# Patient Record
Sex: Female | Born: 1985 | Race: White | Hispanic: No | Marital: Single | Smoking: Current every day smoker
Health system: Southern US, Community
[De-identification: ages and names within clinical notes are randomized; demographics above are authoritative.]

## PROBLEM LIST (undated history)

## (undated) HISTORY — PX: OTHER SURGICAL HISTORY: SHX169

---
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
## 2010-04-28 ENCOUNTER — Emergency Department: Payer: Self-pay | Admitting: Internal Medicine

## 2011-04-20 ENCOUNTER — Emergency Department: Payer: Self-pay | Admitting: Emergency Medicine

## 2011-04-20 LAB — URINALYSIS, COMPLETE
Blood: NEGATIVE
Ketone: NEGATIVE
Ph: 7 (ref 4.5–8.0)
Protein: NEGATIVE
RBC,UR: 2 /HPF (ref 0–5)
Squamous Epithelial: 5

## 2011-04-20 LAB — PREGNANCY, URINE: Pregnancy Test, Urine: NEGATIVE m[IU]/mL

## 2011-07-24 DIAGNOSIS — J45909 Unspecified asthma, uncomplicated: Secondary | ICD-10-CM | POA: Insufficient documentation

## 2012-02-01 ENCOUNTER — Emergency Department: Payer: Self-pay | Admitting: Emergency Medicine

## 2012-02-01 LAB — CBC
HCT: 41.9 % (ref 35.0–47.0)
HGB: 14.7 g/dL (ref 12.0–16.0)
MCH: 30.6 pg (ref 26.0–34.0)
MCV: 87 fL (ref 80–100)
Platelet: 240 10*3/uL (ref 150–440)
RBC: 4.8 10*6/uL (ref 3.80–5.20)
WBC: 5.3 10*3/uL (ref 3.6–11.0)

## 2012-02-01 LAB — LIPASE, BLOOD: Lipase: 111 U/L (ref 73–393)

## 2012-02-01 LAB — PREGNANCY, URINE: Pregnancy Test, Urine: NEGATIVE m[IU]/mL

## 2012-02-01 LAB — URINALYSIS, COMPLETE
Ketone: NEGATIVE
Nitrite: NEGATIVE
Ph: 5 (ref 4.5–8.0)
Protein: NEGATIVE
RBC,UR: 34 /HPF (ref 0–5)
WBC UR: 7 /HPF (ref 0–5)

## 2012-02-01 LAB — COMPREHENSIVE METABOLIC PANEL
Albumin: 4.5 g/dL (ref 3.4–5.0)
Alkaline Phosphatase: 76 U/L (ref 50–136)
Anion Gap: 5 — ABNORMAL LOW (ref 7–16)
BUN: 11 mg/dL (ref 7–18)
Bilirubin,Total: 1 mg/dL (ref 0.2–1.0)
Calcium, Total: 9.1 mg/dL (ref 8.5–10.1)
Chloride: 109 mmol/L — ABNORMAL HIGH (ref 98–107)
Co2: 27 mmol/L (ref 21–32)
Glucose: 86 mg/dL (ref 65–99)
Potassium: 3.9 mmol/L (ref 3.5–5.1)
SGOT(AST): 20 U/L (ref 15–37)
Sodium: 141 mmol/L (ref 136–145)
Total Protein: 8.3 g/dL — ABNORMAL HIGH (ref 6.4–8.2)

## 2012-11-28 ENCOUNTER — Emergency Department: Payer: Self-pay | Admitting: Emergency Medicine

## 2012-11-28 LAB — URINALYSIS, COMPLETE
Glucose,UR: NEGATIVE mg/dL (ref 0–75)
Nitrite: NEGATIVE
Ph: 7 (ref 4.5–8.0)
RBC,UR: 1 /HPF (ref 0–5)
Specific Gravity: 1.015 (ref 1.003–1.030)

## 2012-11-28 LAB — COMPREHENSIVE METABOLIC PANEL
Albumin: 4 g/dL (ref 3.4–5.0)
BUN: 7 mg/dL (ref 7–18)
Bilirubin,Total: 0.6 mg/dL (ref 0.2–1.0)
Calcium, Total: 9.3 mg/dL (ref 8.5–10.1)
Chloride: 105 mmol/L (ref 98–107)
Co2: 29 mmol/L (ref 21–32)
Creatinine: 0.75 mg/dL (ref 0.60–1.30)
EGFR (African American): >60
Glucose: 92 mg/dL (ref 65–99)
Potassium: 3.9 mmol/L (ref 3.5–5.1)
SGOT(AST): 12 U/L — ABNORMAL LOW (ref 15–37)
Sodium: 138 mmol/L (ref 136–145)

## 2012-11-28 LAB — CBC
MCH: 30.6 pg (ref 26.0–34.0)
MCHC: 35.1 g/dL (ref 32.0–36.0)
MCV: 87 fL (ref 80–100)
Platelet: 223 10*3/uL (ref 150–440)
RDW: 12.6 % (ref 11.5–14.5)

## 2014-03-04 ENCOUNTER — Emergency Department: Payer: Self-pay | Admitting: Emergency Medicine

## 2014-03-04 LAB — COMPREHENSIVE METABOLIC PANEL
AST: 18 U/L (ref 15–37)
Albumin: 4.7 g/dL (ref 3.4–5.0)
Alkaline Phosphatase: 79 U/L
Anion Gap: 7 (ref 7–16)
BUN: 14 mg/dL (ref 7–18)
Bilirubin,Total: 0.7 mg/dL (ref 0.2–1.0)
CALCIUM: 9.4 mg/dL (ref 8.5–10.1)
CO2: 28 mmol/L (ref 21–32)
Chloride: 104 mmol/L (ref 98–107)
Creatinine: 0.91 mg/dL (ref 0.60–1.30)
EGFR (African American): >60
EGFR (Non-African Amer.): >60
Glucose: 98 mg/dL (ref 65–99)
Osmolality: 278 (ref 275–301)
Potassium: 3.4 mmol/L — ABNORMAL LOW (ref 3.5–5.1)
SGPT (ALT): 22 U/L
SODIUM: 139 mmol/L (ref 136–145)
Total Protein: 8.6 g/dL — ABNORMAL HIGH (ref 6.4–8.2)

## 2014-03-04 LAB — URINALYSIS, COMPLETE
BILIRUBIN, UR: NEGATIVE
Glucose,UR: NEGATIVE mg/dL (ref 0–75)
KETONE: NEGATIVE
Nitrite: NEGATIVE
PROTEIN: NEGATIVE
Ph: 6 (ref 4.5–8.0)
RBC,UR: 4 /HPF (ref 0–5)
SPECIFIC GRAVITY: 1.019 (ref 1.003–1.030)
Squamous Epithelial: 30
WBC UR: 23 /HPF (ref 0–5)

## 2014-03-04 LAB — CBC
HCT: 43.8 % (ref 35.0–47.0)
HGB: 15 g/dL (ref 12.0–16.0)
MCH: 30.4 pg (ref 26.0–34.0)
MCHC: 34.2 g/dL (ref 32.0–36.0)
MCV: 89 fL (ref 80–100)
Platelet: 282 10*3/uL (ref 150–440)
RBC: 4.93 10*6/uL (ref 3.80–5.20)
RDW: 12.9 % (ref 11.5–14.5)
WBC: 7.3 10*3/uL (ref 3.6–11.0)

## 2014-03-04 LAB — LIPASE, BLOOD: Lipase: 202 U/L (ref 73–393)

## 2015-03-29 IMAGING — CT CT ABD-PELV W/ CM
1 of 2 series · 16 of 32 positions shown, 20 images · non-contrast
Comparison: none

REASON FOR EXAM: (1) RLQ pain; (2) RLQ pain;    NOTE: Nursing to Give
Oral CT Contrast
COMMENTS:

PROCEDURE:     CT  - CT ABDOMEN / PELVIS  W  - November 28, 2012  [DATE]
RESULT:
Comparison is made to a prior study dated 02/01/2012.
TECHNIQUE: Helical 3 mm sections were obtained from the lung bases through
the pubic symphysis status post intravenous administration of 85 ml of
Lsovue-34M.

[Series 2: 3mm soft tissue · axial · 0.66mm/px · z∈[+141,+564]mm · 16 of 155 slices shown, 20 images]
[im 7/155  soft-tissue]
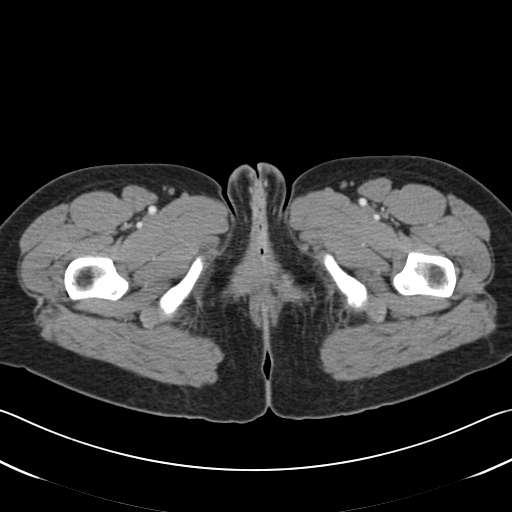
[im 7/155  bone]
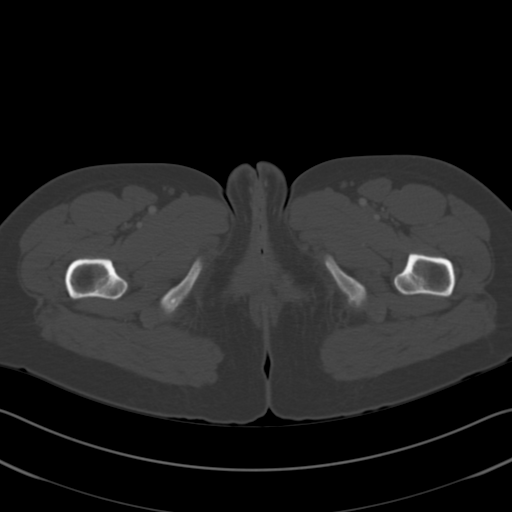
[im 20/155  soft-tissue]
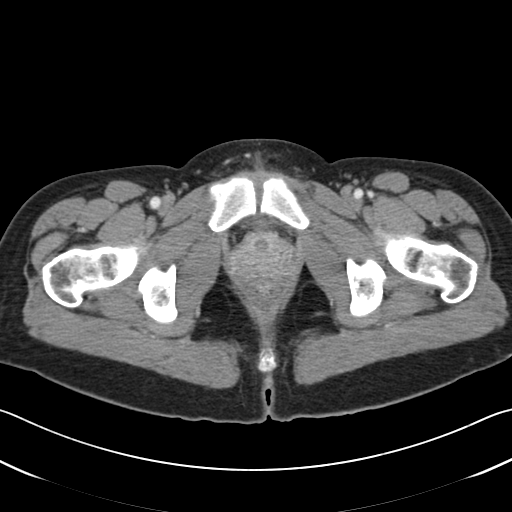
[im 33/155  soft-tissue]
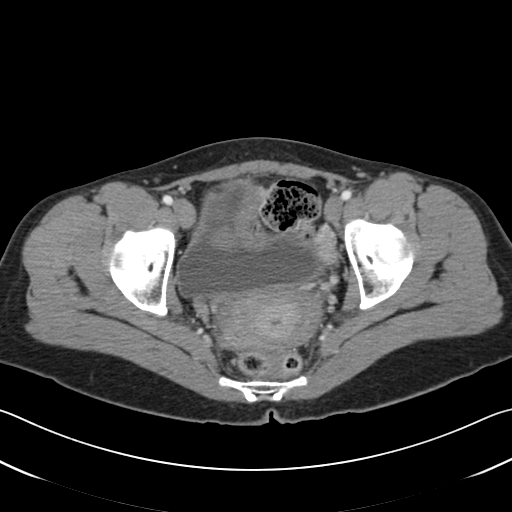
[im 39/155  soft-tissue]
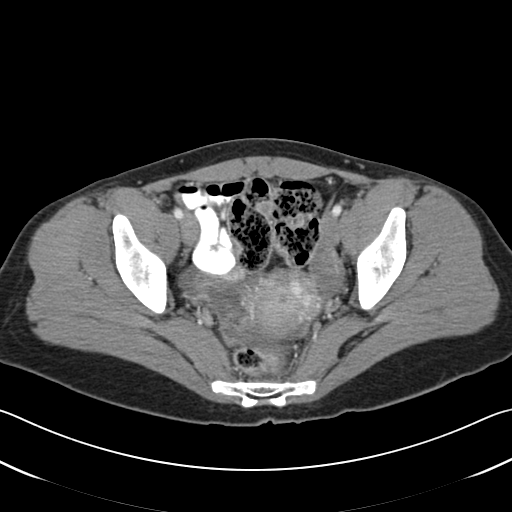
[im 52/155  soft-tissue]
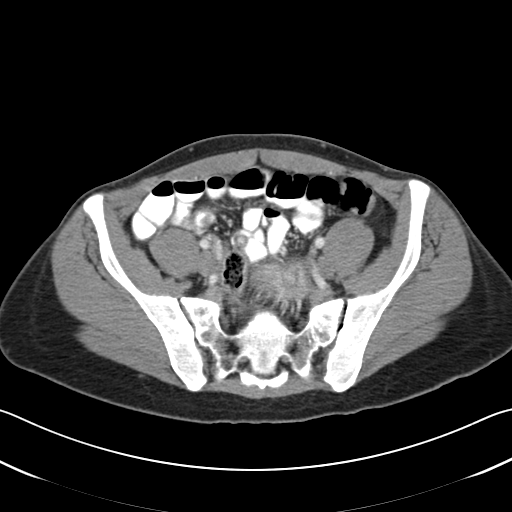
[im 65/155  soft-tissue]
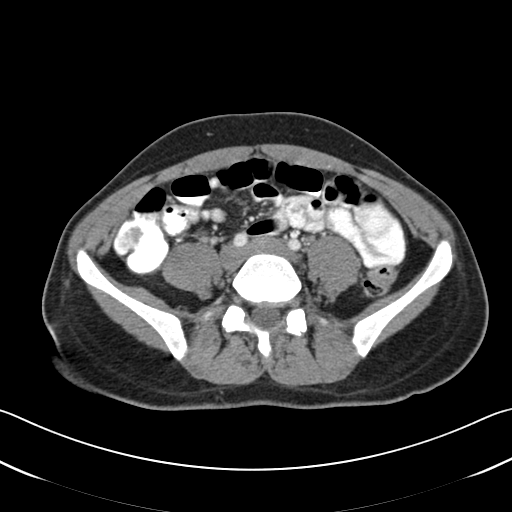
[im 71/155  soft-tissue]
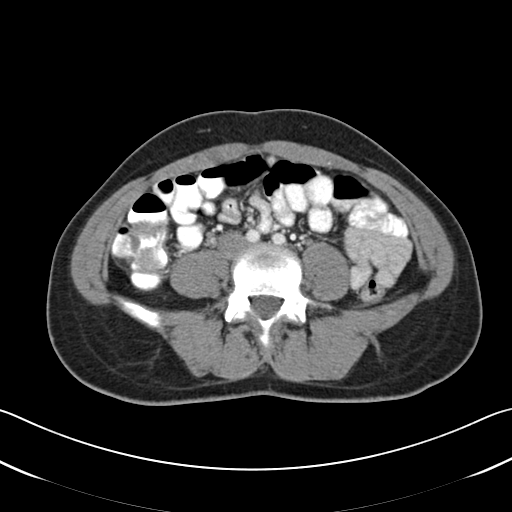
[im 84/155  soft-tissue]
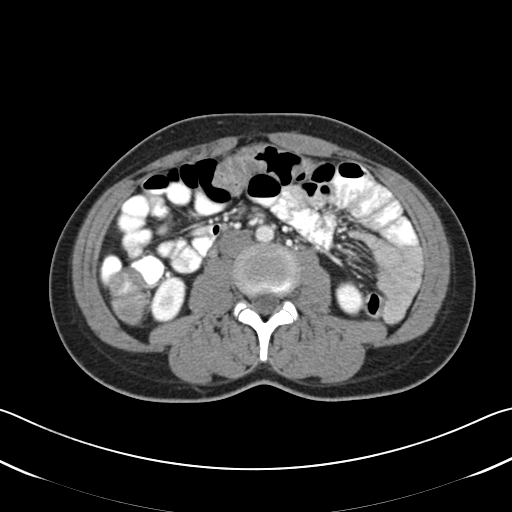
[im 90/155  soft-tissue]
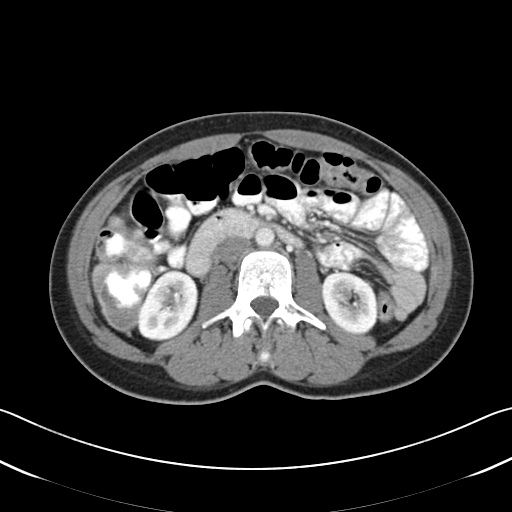
[im 90/155  bone]
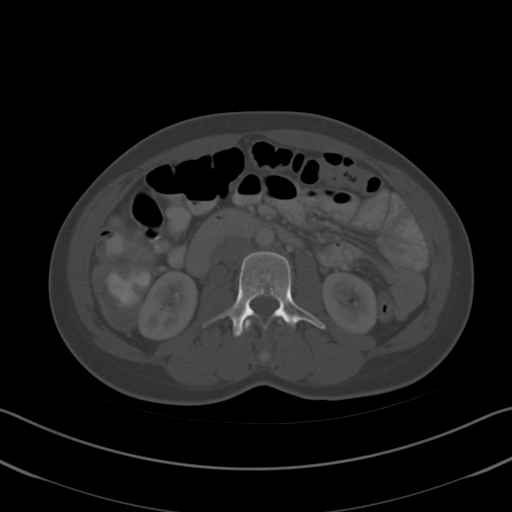
[im 103/155  soft-tissue]
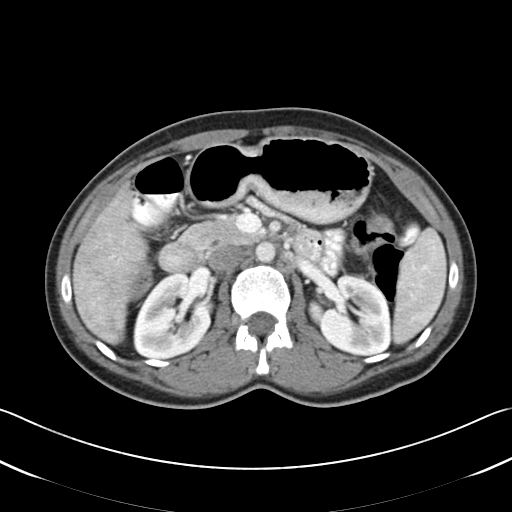
[im 116/155  soft-tissue]
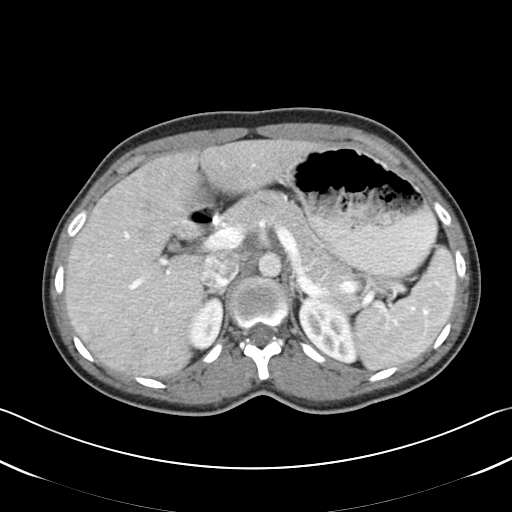
[im 122/155  soft-tissue]
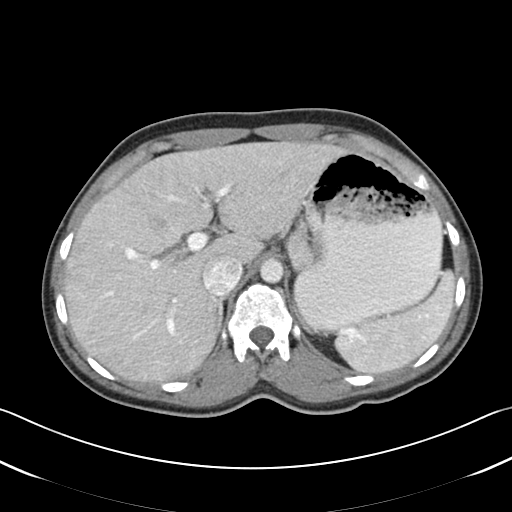
[im 129/155  lung]
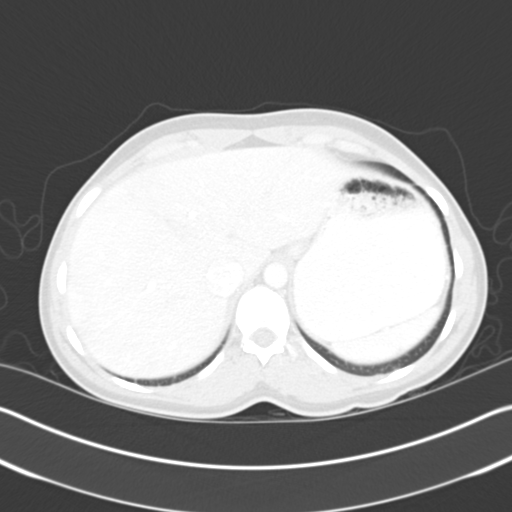
[im 135/155  soft-tissue]
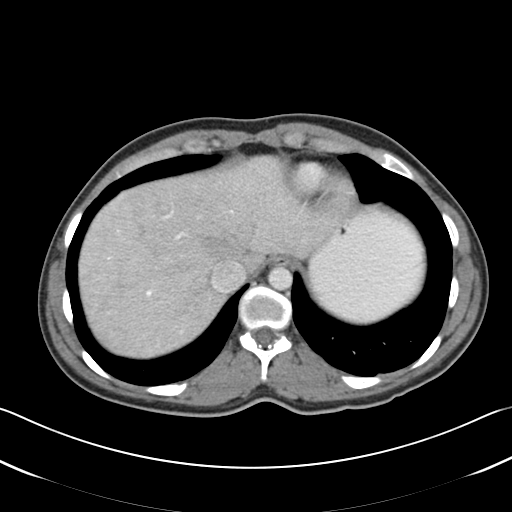
[im 135/155  lung]
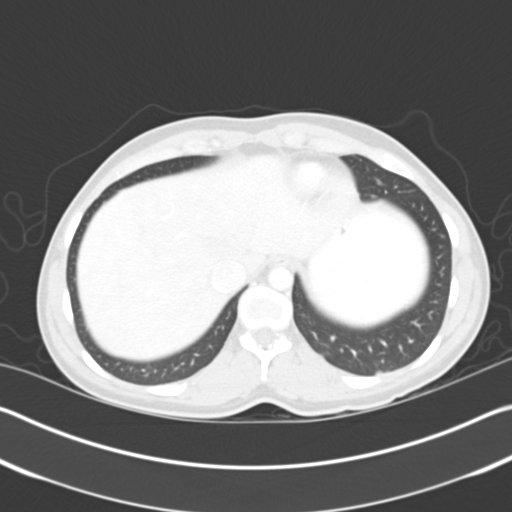
[im 142/155  lung]
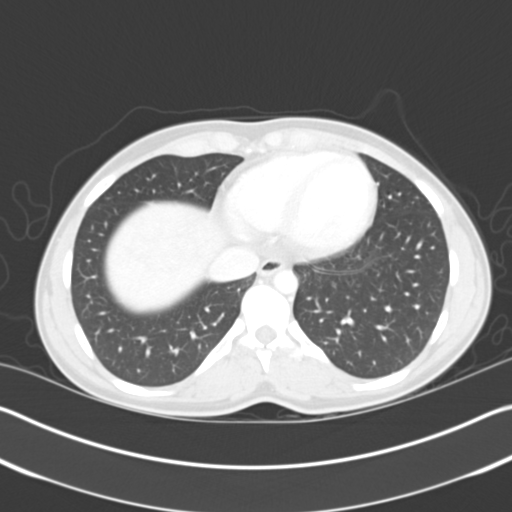
[im 148/155  soft-tissue]
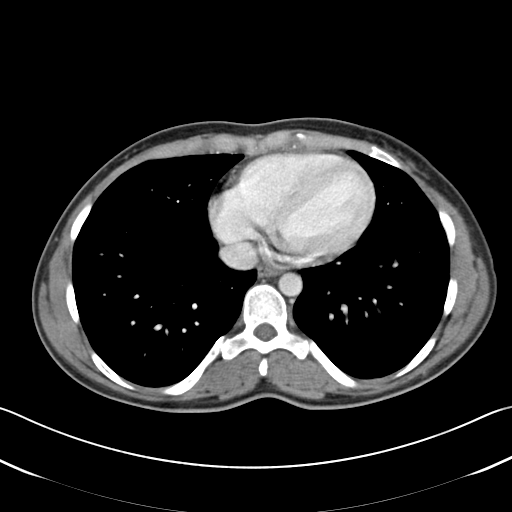
[im 148/155  lung]
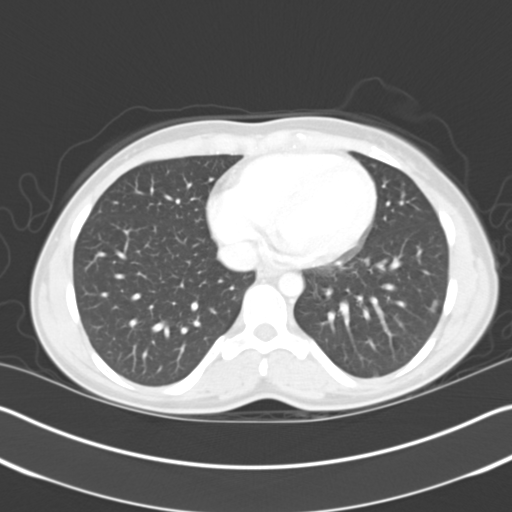

[16 of 32 positions shown; findings below may reference images not displayed]

FINDINGS: The lung bases are unremarkable.

The liver, spleen, adrenals, pancreas, and kidneys are unremarkable. There
is no CT evidence of bowel obstruction. There are no secondary signs
reflecting enteritis, colitis, diverticulitis, or appendicitis. The appendix
is identified and is unremarkable. There is no evidence of an abdominal
aortic aneurysm. The celiac, SMA, IMA, and portal vein are opacified.
IMPRESSION: No CT evidence of obstructive or inflammatory abnormalities.

## 2015-04-22 LAB — HM HIV SCREENING LAB: HM HIV Screening: NEGATIVE

## 2016-01-25 DIAGNOSIS — E663 Overweight: Secondary | ICD-10-CM | POA: Insufficient documentation

## 2016-01-25 DIAGNOSIS — Z8742 Personal history of other diseases of the female genital tract: Secondary | ICD-10-CM | POA: Insufficient documentation

## 2016-01-25 LAB — HM PAP SMEAR: HM Pap smear: NEGATIVE

## 2017-05-26 ENCOUNTER — Emergency Department: Payer: Self-pay

## 2017-05-26 ENCOUNTER — Emergency Department
Admission: EM | Admit: 2017-05-26 | Discharge: 2017-05-26 | Disposition: A | Discharge status: Home / Self Care | Payer: Self-pay | Attending: Emergency Medicine | Admitting: Emergency Medicine

## 2017-05-26 ENCOUNTER — Encounter: Payer: Self-pay | Admitting: Emergency Medicine

## 2017-05-26 ENCOUNTER — Other Ambulatory Visit: Payer: Self-pay

## 2017-05-26 DIAGNOSIS — F17210 Nicotine dependence, cigarettes, uncomplicated: Secondary | ICD-10-CM | POA: Insufficient documentation

## 2017-05-26 DIAGNOSIS — M79641 Pain in right hand: Secondary | ICD-10-CM | POA: Insufficient documentation

## 2017-05-26 MED ORDER — NAPROXEN 500 MG PO TABS: 500.0000 mg | ORAL_TABLET | Freq: Two times a day (BID) | ORAL | 0 refills | Status: DC

## 2017-05-26 MED ORDER — TRAMADOL HCL 50 MG PO TABS: 50.0000 mg | ORAL_TABLET | Freq: Four times a day (QID) | ORAL | 0 refills | Status: DC | PRN

## 2017-05-26 NOTE — Discharge Instructions (Signed)
Please follow up with the orthopedic doctor if not improving over the week. ° °Return to the ER for symptoms that change or worsen if unable to schedule an appointment. °

## 2017-05-26 NOTE — ED Triage Notes (Signed)
States she fell last pm  Having pain to right hand /thumb area

## 2017-05-26 NOTE — ED Provider Notes (Signed)
[facility] Emergency Department Provider Note ____________________________________________  Time seen: Approximately 12:10 PM  I have reviewed the triage vital signs and the nursing notes.   HISTORY  Chief Complaint Hand Pain    HPI Holly Solomon is a 32 y.o. female who presents to the emergency department for evaluation of right hand pain after mechanical, non-syncopal fall last night.  Patient fell on both outstretched hands, however the right hand is painful and swollen today.  She states that she has taken some Tylenol with little relief.  She states that she also slept in a wrist and hand brace but upon awakening this morning noticed that the swelling was worse and she has pain with flexion of the thumb.  History reviewed. No pertinent past medical history.  There are no active problems to display for this patient.   History reviewed. No pertinent surgical history.  Prior to Admission medications   Medication Sig Start Date End Date Taking? Authorizing Provider  naproxen (NAPROSYN) 500 MG tablet Take 1 tablet (500 mg total) by mouth 2 (two) times daily with a meal. 05/26/17   Jancie Kercher B, FNP  traMADol (ULTRAM) 50 MG tablet Take 1 tablet (50 mg total) by mouth every 6 (six) hours as needed. 05/26/17   Chinita Pesterriplett, Marcel Gary B, FNP    Allergies Norco [hydrocodone-acetaminophen] and Percocet [oxycodone-acetaminophen]  No family history on file.  Social History Social History   Tobacco Use  . Smoking status: Current Every Day Smoker  . Smokeless tobacco: Never Used  Substance Use Topics  . Alcohol use: Yes  . Drug use: Not on file    Review of Systems Constitutional: Negative for recent illness. Cardiovascular: Negative for active bleeding Respiratory: Negative for cough or shortness of breath Musculoskeletal: Positive for right hand and wrist pain Skin: Negative for lesion or wound Neurological: Negative for paresthesias, specifically of the  right hand.  ____________________________________________   PHYSICAL EXAM:  VITAL SIGNS: ED Triage Vitals  Enc Vitals Group     BP 05/26/17 1032 136/81     Pulse Rate 05/26/17 1032 79     Resp 05/26/17 1032 20     Temp 05/26/17 1032 98.1 F (36.7 C)     Temp Source 05/26/17 1032 Oral     SpO2 05/26/17 1032 100 %     Weight 05/26/17 1033 133 lb (60.3 kg)     Height 05/26/17 1033 5\' 1"  (1.549 m)     Head Circumference --      Peak Flow --      Pain Score 05/26/17 1038 9     Pain Loc --      Pain Edu? --      Excl. in GC? --     Constitutional: Alert and oriented. Well appearing and in no acute distress. Eyes: Conjunctivae are clear without discharge or drainage Head: Atraumatic Neck: Supple Respiratory: Respirations even and unlabored. Musculoskeletal: Tenderness is elicited with palpation over the thenar eminence of the right thumb.  No tenderness in the snuffbox.  No focal tenderness over the wrist or metacarpals.  There is full range of motion of the fingers.  No tenderness over the elbow or shoulder.   Neurologic: Motor and sensory function is intact.  Awake, alert, oriented x4. Skin: Intact Psychiatric: Affect and behavior are appropriate.  ____________________________________________   LABS (all labs ordered are listed, but only abnormal results are displayed)  Labs Reviewed - No data to display ____________________________________________  RADIOLOGY  Right hand shows no evidence  of fracture or dislocation per radiology. ____________________________________________   PROCEDURES  Procedures  ____________________________________________   INITIAL IMPRESSION / ASSESSMENT AND PLAN / ED COURSE  Holly Solomon is a 32 y.o. female who presents to the emergency department after mechanical, non-syncopal fall last night.  Images and exam are both consistent with musculoskeletal strain.  She will be placed in a thumb spica splint and encouraged to wear it for the  week.  After 1 week she was advised to remove the splint and perform range of motion.  If she continues to have pain or swelling she is to call and schedule follow-up appointment with orthopedics.  She will be given a prescription for Naprosyn and tramadol.  She is to return to the emergency department for symptoms that change or worsen or for new concerns if she is unable to schedule an appointment.  Medications - No data to display  Pertinent labs & imaging results that were available during my care of the patient were reviewed by me and considered in my medical decision making (see chart for details).  _________________________________________   FINAL CLINICAL IMPRESSION(S) / ED DIAGNOSES  Final diagnoses:  Hand pain, right    ED Discharge Orders        Ordered    naproxen (NAPROSYN) 500 MG tablet  2 times daily with meals     05/26/17 1207    traMADol (ULTRAM) 50 MG tablet  Every 6 hours PRN     05/26/17 1207       If controlled substance prescribed during this visit, 12 month history viewed on the [facility] prior to issuing an initial prescription for Schedule II or III opiod.    Chinita Pester, FNP 05/26/17 1215    Governor Rooks, MD 05/26/17 [phone]

## 2018-09-23 DIAGNOSIS — E663 Overweight: Secondary | ICD-10-CM

## 2018-09-23 DIAGNOSIS — J45909 Unspecified asthma, uncomplicated: Secondary | ICD-10-CM

## 2018-09-23 DIAGNOSIS — Z8742 Personal history of other diseases of the female genital tract: Secondary | ICD-10-CM

## 2018-09-29 ENCOUNTER — Ambulatory Visit (LOCAL_COMMUNITY_HEALTH_CENTER): Payer: Self-pay | Admitting: Physician Assistant

## 2018-09-29 ENCOUNTER — Other Ambulatory Visit: Payer: Self-pay

## 2018-09-29 VITALS — BP 129/78 | Ht 62.0 in | Wt 150.8 lb

## 2018-09-29 DIAGNOSIS — Z3009 Encounter for other general counseling and advice on contraception: Secondary | ICD-10-CM

## 2018-09-29 DIAGNOSIS — Z3042 Encounter for surveillance of injectable contraceptive: Secondary | ICD-10-CM

## 2018-09-29 NOTE — Progress Notes (Signed)
Patient here, wants to restart Depo today.

## 2018-09-30 ENCOUNTER — Encounter: Payer: Self-pay | Admitting: Physician Assistant

## 2018-09-30 MED ORDER — MEDROXYPROGESTERONE ACETATE 150 MG/ML IM SUSP
150.0000 mg | Freq: Once | INTRAMUSCULAR | Status: AC
  Administered 2019-10-01: 150 mg via INTRAMUSCULAR

## 2018-09-30 NOTE — Progress Notes (Signed)
  Family Planning Visit- Repeat Yearly Visit  Subjective:  Holly Solomon is a 33 y.o. being seen today for an well woman visit and to discuss family planning options.    She is currently using Depo-Provera injections for pregnancy prevention. Patient reports she does not if she or her partner wants a pregnancy in the next year. Patient has the following medical conditionshas History of abnormal cervical Pap smear; Overweight; and Asthma on their problem list.  Chief Complaint  Patient presents with  . Contraception    wants to restart Depo    Patient reports that she has been having  Regular periods for the past few months.  Would like to restart Depo today.  LMP 09/15/18 and was normal and last sex was 09/08/18.    Patient denies problems with Depo in the past.  Does the patient desire a pregnancy in the next year? (OKQ flowsheet)  See flowsheet for other program required questions.   Body mass index is 27.58 kg/m. - Patient is eligible for diabetes screening based on BMI and age >44?  not applicable WN0U ordered? no  Patient reports 1 of partners in last year. Desires STI screening?  No - .  Does the patient have a current or past history of drug use? No   No components found for: HCV]   There are no preventive care reminders to display for this patient.  Review of Systems  Constitutional: Negative.     The following portions of the patient's history were reviewed and updated as appropriate: allergies, current medications, past family history, past medical history, past social history, past surgical history and problem list. Problem list updated.  Objective:   Vitals:   09/29/18 0915  BP: 129/78  Weight: 150 lb 12.8 oz (68.4 kg)  Height: 5\' 2"  (1.575 m)    Physical Exam Constitutional:      Appearance: Normal appearance.  Neurological:     Mental Status: She is alert and oriented to person, place, and time.  Psychiatric:        Mood and Affect: Mood normal.        Assessment and Plan:  Ayn Domangue is a 33 y.o. female presenting to the [facility] Department for /family planning visit to restart her method of choice.  Contraception counseling: Reviewed all forms of birth control options available including abstinence; over the counter/barrier methods; hormonal contraceptive medication including pill, patch, ring, injection,contraceptive implant; hormonal and nonhormonal IUDs; permanent sterilization options including vasectomy and the various tubal sterilization modalities. Risks and benefits reviewed.  Questions were answered.  Written information was also given to the patient to review.  Patient desires Depo, this was prescribed for patient. She will follow up in  3 months for surveillance.  She was told to call with any further questions, or with any concerns about this method of contraception.  Emphasized use of condoms 100% of the time for STI prevention.  There are no diagnoses linked to this encounter.    No follow-ups on file.  No future appointments.  Jerene Dilling, PA

## 2019-01-29 ENCOUNTER — Other Ambulatory Visit: Payer: Self-pay

## 2019-01-29 ENCOUNTER — Ambulatory Visit (LOCAL_COMMUNITY_HEALTH_CENTER): Payer: Self-pay | Admitting: Physician Assistant

## 2019-01-29 VITALS — BP 121/74 | Ht 62.0 in | Wt 148.0 lb

## 2019-01-29 DIAGNOSIS — Z3042 Encounter for surveillance of injectable contraceptive: Secondary | ICD-10-CM

## 2019-01-29 DIAGNOSIS — Z30013 Encounter for initial prescription of injectable contraceptive: Secondary | ICD-10-CM

## 2019-01-29 DIAGNOSIS — Z3009 Encounter for other general counseling and advice on contraception: Secondary | ICD-10-CM

## 2019-01-29 MED ORDER — MEDROXYPROGESTERONE ACETATE 150 MG/ML IM SUSP
150.0000 mg | Freq: Once | INTRAMUSCULAR | Status: AC
  Administered 2019-01-29: 150 mg via INTRAMUSCULAR

## 2019-01-30 ENCOUNTER — Encounter: Payer: Self-pay | Admitting: Physician Assistant

## 2019-01-30 NOTE — Progress Notes (Signed)
Family Planning Visit-  Subjective:  Holly Solomon is a 33 y.o. being seen today for a Depo shot.    She is currently using Depo-Provera injections for pregnancy prevention. Patient reports she does not wants a pregnancy in the next year. Patient  has History of abnormal cervical Pap smear; Overweight; and Asthma on their problem list.  Chief Complaint  Patient presents with  . Contraception    Patient reports that her Depo shot was due around 12/15/2018, but she has not had time to come in for her shot.  Last sex was over 1 month ago, when Depo was still effective.  Had a normal period 01/21/2019.  Would like to restart Depo today.  Reports that her MGM died of non-Hodgkin's lymphoma since her last RP.  Smoking 1/2 ppd.  Patient denies any change to personal history since last RP.  Denies concerns today.   Does the patient desire a pregnancy in the next year? (OKQ flowsheet)  See flowsheet for other program required questions.   There is no height or weight on file to calculate BMI. - Patient is eligible for diabetes screening based on BMI and age >1?  not applicable HA1C ordered? not applicable  Patient reports 1 of partners in last year. Desires STI screening?  No - .  Does the patient have a current or past history of drug use? No   No components found for: HCV]   Health Maintenance Due  Topic Date Due  . INFLUENZA VACCINE  11/01/2018  . PAP SMEAR-Modifier  01/25/2019    Review of Systems  All other systems reviewed and are negative.   The following portions of the patient's history were reviewed and updated as appropriate: allergies, current medications, past family history, past medical history, past social history, past surgical history and problem list. Problem list updated.  Objective:  There were no vitals filed for this visit.  Physical Exam Vitals signs reviewed.  Constitutional:      General: She is not in acute distress.    Appearance: Normal  appearance. She is normal weight.  HENT:     Head: Normocephalic and atraumatic.  Eyes:     Conjunctiva/sclera: Conjunctivae normal.  Pulmonary:     Effort: Pulmonary effort is normal.  Neurological:     Mental Status: She is alert and oriented to person, place, and time.  Psychiatric:        Mood and Affect: Mood normal.        Behavior: Behavior normal.        Thought Content: Thought content normal.        Judgment: Judgment normal.       Assessment and Plan:  Holly Solomon is a 33 y.o. female presenting to the [facility] Department for a Depo restart.  Contraception counseling: Reviewed all forms of birth control options available including abstinence; over the counter/barrier methods; hormonal contraceptive medication including pill, patch, ring, injection,contraceptive implant; hormonal and nonhormonal IUDs; permanent sterilization options including vasectomy and the various tubal sterilization modalities. Risks and benefits reviewed.  Questions were answered.  Written information was also given to the patient to review.  Patient desires to restart Depo, this was prescribed for patient. She will follow up in  3 months for pap and  for surveillance.  She was told to call with any further questions, or with any concerns about this method of contraception.  Emphasized use of condoms 100% of the time for STI prevention.  1. Encounter for counseling  regarding contraception Counseled patient to use condoms for 2 weeks after shot today. Counseled to call clinic with irregular bleeding. Counseled that she is due for pap but patient is unable to stay for that today and states that she will get that with next Depo.  2. Surveillance for Depo-Provera contraception OK for Depo 150 mg IM q 11-13 weeks for 1 year. - medroxyPROGESTERone (DEPO-PROVERA) injection 150 mg     No follow-ups on file.  No future appointments.  Jerene Dilling, PA

## 2019-06-02 ENCOUNTER — Ambulatory Visit: Payer: Self-pay

## 2019-09-24 IMAGING — DX DG HAND COMPLETE 3+V*R*
3 series · 3 of 3 positions shown · non-contrast
Comparison: None.

CLINICAL DATA: Right hand pain after fall last night

EXAM:
RIGHT HAND - COMPLETE 3+ VIEW

[hand ap]
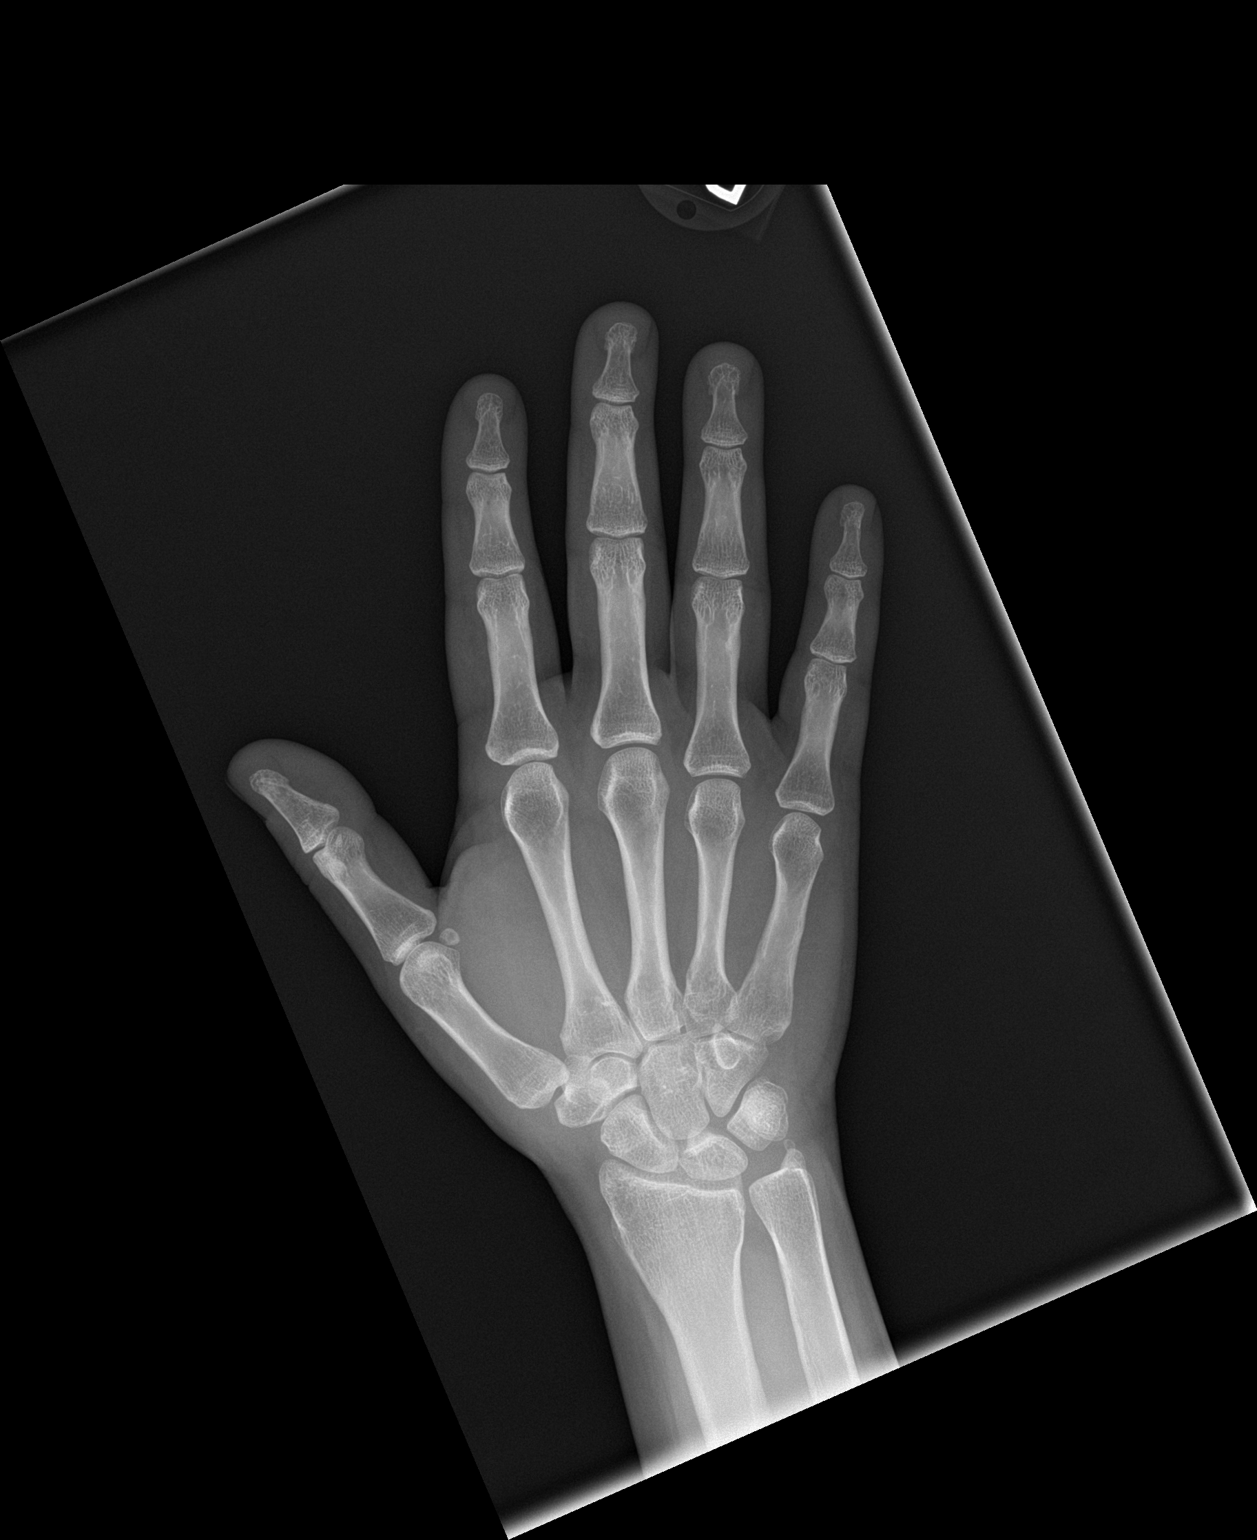

[hand obl]
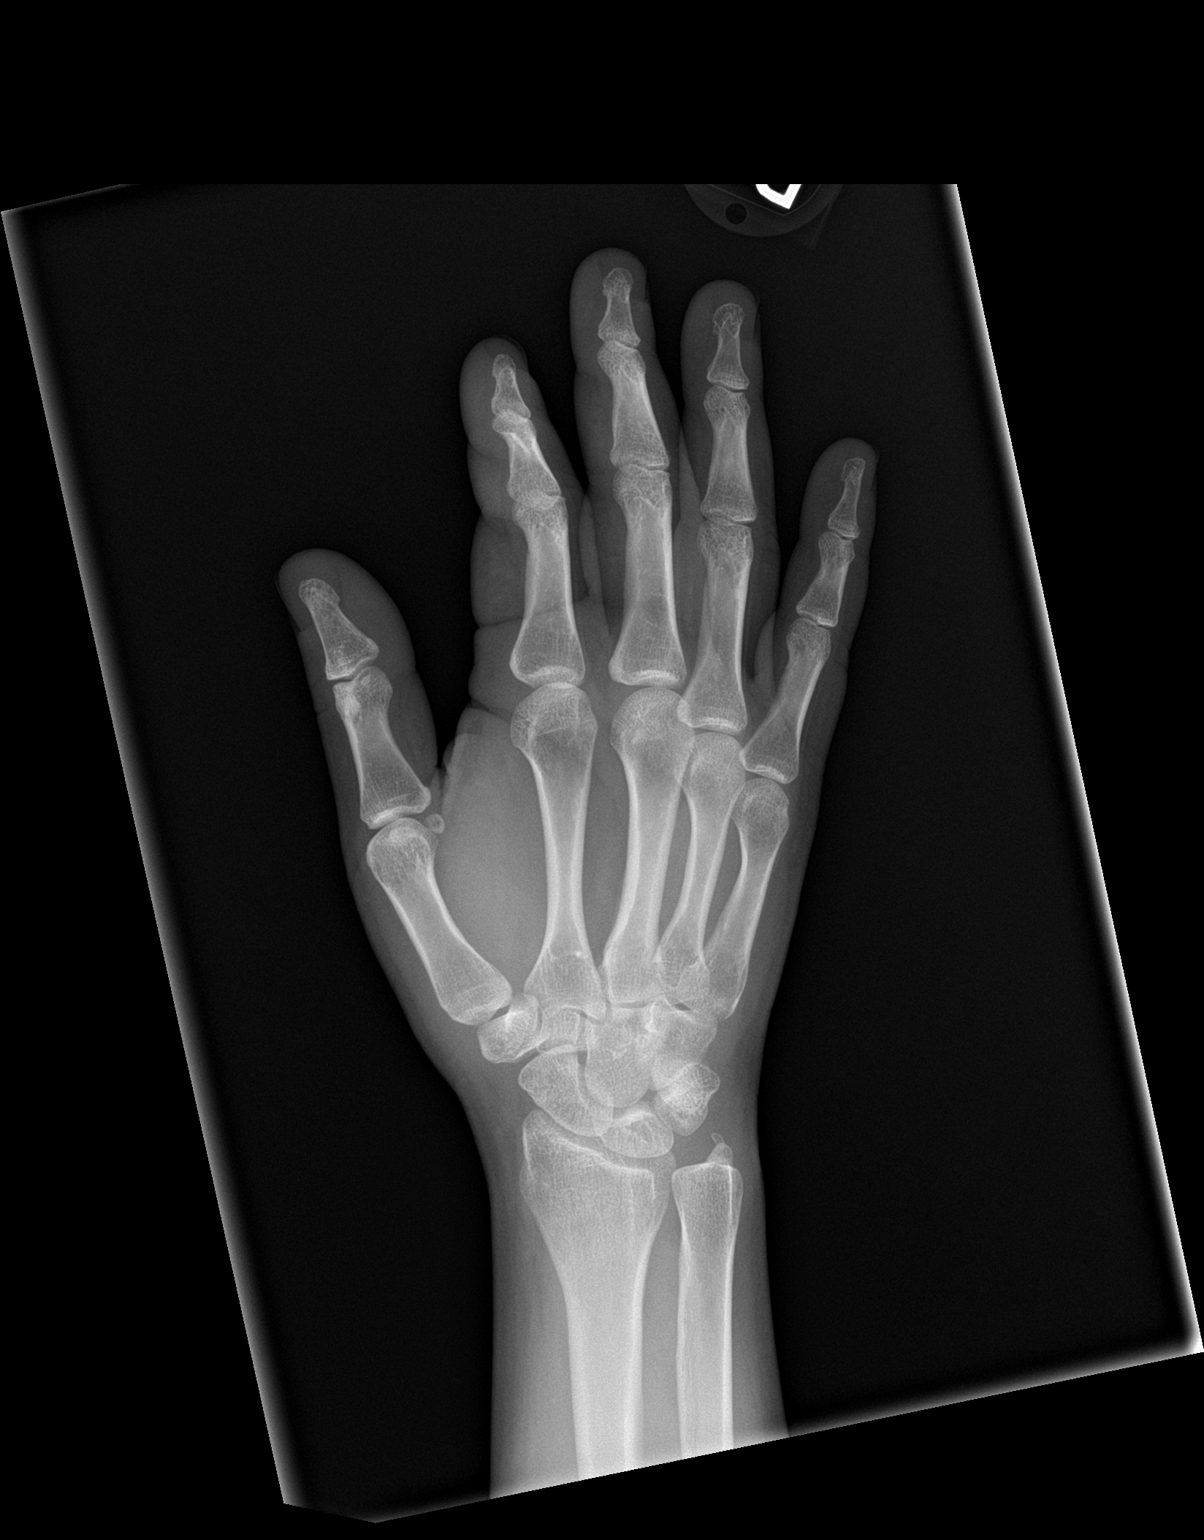

[hand lat]
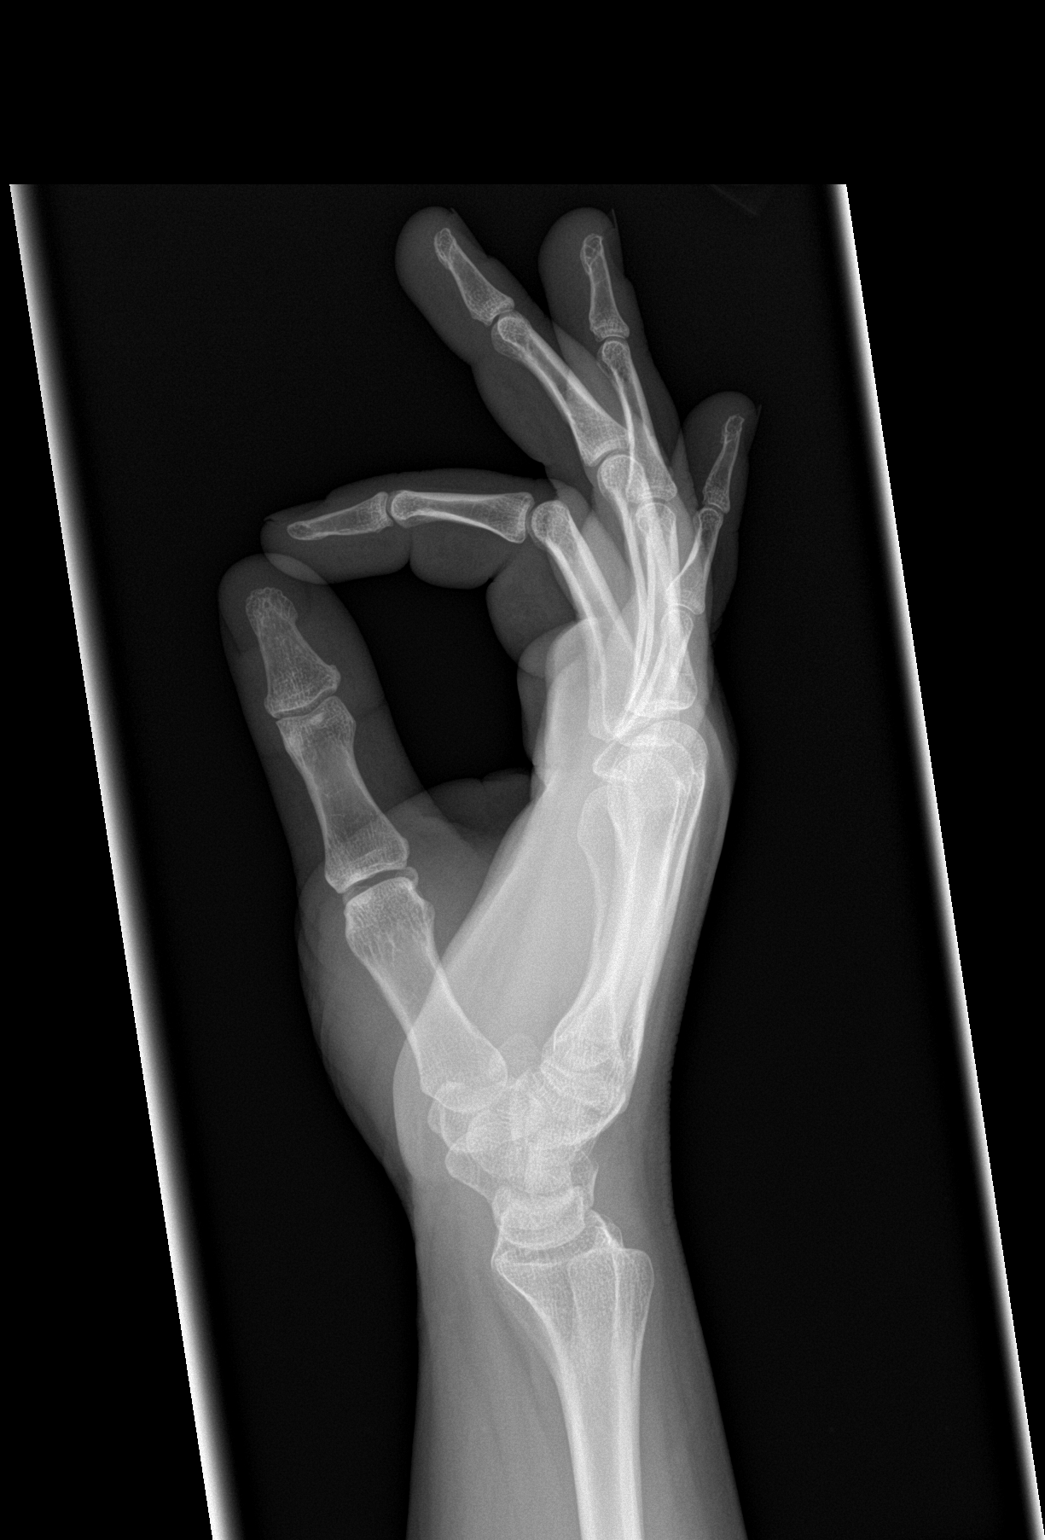

[3 of 3 positions shown; findings below may reference images not displayed]

FINDINGS: There is no evidence of fracture or dislocation. There is no
evidence of arthropathy or other focal bone abnormality. Soft
tissues are unremarkable.
IMPRESSION: No fracture or dislocation.

## 2019-09-29 ENCOUNTER — Ambulatory Visit: Payer: Self-pay

## 2019-10-01 ENCOUNTER — Ambulatory Visit (LOCAL_COMMUNITY_HEALTH_CENTER): Payer: Self-pay | Admitting: Physician Assistant

## 2019-10-01 ENCOUNTER — Ambulatory Visit: Payer: Self-pay

## 2019-10-01 ENCOUNTER — Other Ambulatory Visit: Payer: Self-pay

## 2019-10-01 ENCOUNTER — Encounter: Payer: Self-pay | Admitting: Physician Assistant

## 2019-10-01 VITALS — BP 106/67 | Ht 64.0 in | Wt 145.0 lb

## 2019-10-01 DIAGNOSIS — Z113 Encounter for screening for infections with a predominantly sexual mode of transmission: Secondary | ICD-10-CM

## 2019-10-01 DIAGNOSIS — Z3009 Encounter for other general counseling and advice on contraception: Secondary | ICD-10-CM

## 2019-10-01 DIAGNOSIS — Z30013 Encounter for initial prescription of injectable contraceptive: Secondary | ICD-10-CM

## 2019-10-01 DIAGNOSIS — Z01419 Encounter for gynecological examination (general) (routine) without abnormal findings: Secondary | ICD-10-CM

## 2019-10-01 NOTE — Progress Notes (Signed)
Pt to clinic for physical and to restart depo. Last depo was 01/29/2019; 35.0 weeks post depo.

## 2019-10-01 NOTE — Progress Notes (Signed)
Family Planning Visit- Repeat Yearly Visit  Subjective:  Holly Solomon is a 34 y.o. G1P1001  being seen today for an well woman visit and to discuss family planning options.    She is currently using none for pregnancy prevention. Patient reports she does not  want a pregnancy in the next year. Patient  has History of abnormal cervical Pap smear; Overweight; and Asthma on their problem list.  Chief Complaint  Patient presents with  . Annual Exam  . Contraception    depo (in hip)    Patient reports that she would like to restart Depo.  States that she thinks she would like some information about Nexplanon since she has trouble remembering to come q 3 months for Depo shot.  States that she had LEEP a few years ago and has had normal paps since then.  Per chart review, had colpo and pap at [facility] in 2016 that were normal, this was after LEEP, then had a normal pap in 2017.  Declines pregnancy test today before getting Depo since last had sex over 2 weeks ago and having normal period.    Patient denies any concerns or changes to personal and family history.   See flowsheet for other program required questions.   Body mass index is 24.89 kg/m. - Patient is eligible for diabetes screening based on BMI and age >72?  not applicable HA1C ordered? not applicable  Patient reports 1 of partners in last year. Desires STI screening?  Yes   Has patient been screened once for HCV in the past?  No  No results found for: HCVAB  Does the patient have current of drug use, have a partner with drug use, and/or has been incarcerated since last result? No  If yes-- Screen for HCV through [facility]   Does the patient meet criteria for HBV testing? No  Criteria:  -Household, sexual or needle sharing contact with HBV -History of drug use -HIV positive -Those with known Hep C   Health Maintenance Due  Topic Date Due  . Hepatitis C Screening  Never done  . COVID-19 Vaccine (1) Never done  . PAP  SMEAR-Modifier  01/25/2019    Review of Systems  All other systems reviewed and are negative.   The following portions of the patient's history were reviewed and updated as appropriate: allergies, current medications, past family history, past medical history, past social history, past surgical history and problem list. Problem list updated.  Objective:   Vitals:   10/01/19 1023  BP: 106/67  Weight: 145 lb (65.8 kg)  Height: 5\' 4"  (1.626 m)    Physical Exam Vitals and nursing note reviewed.  Constitutional:      General: She is not in acute distress.    Appearance: Normal appearance. She is normal weight.  HENT:     Head: Normocephalic and atraumatic.  Eyes:     Conjunctiva/sclera: Conjunctivae normal.  Neck:     Thyroid: No thyroid mass, thyromegaly or thyroid tenderness.  Cardiovascular:     Rate and Rhythm: Normal rate and regular rhythm.  Pulmonary:     Effort: Pulmonary effort is normal.     Breath sounds: Normal breath sounds.  Chest:     Breasts:        Right: Normal.        Left: Normal.  Abdominal:     Palpations: Abdomen is soft. There is no mass.     Tenderness: There is no abdominal tenderness. There is no  guarding or rebound.  Genitourinary:    General: Normal vulva.     Rectum: Normal.     Comments: External genitalia/pubic area without nits, lice, edema, erythema, lesions and inguinal adenopathy. Vagina with normal mucosa and small amount of menstrual blood present. Cervix without visible lesions. Uterus firm, mobile, nt, no masses, no CMT, no adnexal tenderness or fullness. Musculoskeletal:     Cervical back: Neck supple. No tenderness.  Lymphadenopathy:     Cervical: No cervical adenopathy.     Upper Body:     Right upper body: No supraclavicular, axillary or pectoral adenopathy.     Left upper body: No supraclavicular, axillary or pectoral adenopathy.  Skin:    General: Skin is warm and dry.  Neurological:     Mental Status: She is alert and  oriented to person, place, and time.  Psychiatric:        Mood and Affect: Mood normal.        Behavior: Behavior normal.        Thought Content: Thought content normal.        Judgment: Judgment normal.       Assessment and Plan:  Holly Solomon is a 34 y.o. female G1P1001 presenting to the [facility] Department for an yearly well woman exam/family planning visit  Contraception counseling: Reviewed all forms of birth control options in the tiered based approach. available including abstinence; over the counter/barrier methods; hormonal contraceptive medication including pill, patch, ring, injection,contraceptive implant, ECP; hormonal and nonhormonal IUDs; permanent sterilization options including vasectomy and the various tubal sterilization modalities. Risks, benefits, and typical effectiveness rates were reviewed.  Questions were answered.  Written information was also given to the patient to review.  Patient desires to restart Depo, this was prescribed for patient. She will follow up in  3 months and prn for surveillance.  She was told to call with any further questions, or with any concerns about this method of contraception.  Emphasized use of condoms 100% of the time for STI prevention.  Patient was not a candidate for ECP today.   1. Encounter for counseling regarding contraception Reviewed with patient when to call clinic for irregular bleeding with Depo. Counseled re:  Differences and similarities  between SE of Depo vs Nexplanon. Please give written info on Nexplanon and do consult today. Rec condoms with all sex for 2 weeks after shot today.   2. Well woman exam with routine gynecological exam Reviewed with patient healthy habits for maintenance of normal BMI Enc MVI 1 po daily. Enc to establish with/follow up with PCP for illness, primary care concerns and age appropriate screenings. Await results.  Counseled that RN will call or send letter once results are  back.  - IGP, Aptima HPV  3. Initiation of Depo Provera OK to restart Depo per 01/2019 order today. To call and schedule for Nexplanon insertion if decides to change methods or to get Depo in 11-13 weeks.    4. Screening for STD (sexually transmitted disease) Await test results.  Counseled that RN will call if needs to RTC for treatment once results are back.  - Chlamydia/Gonorrhea [facility]     Return in about 11 weeks (around 12/17/2019) for Depo or PRN for Nexplanon insertion.  No future appointments.  Matt Holmes, PA

## 2019-10-01 NOTE — Progress Notes (Signed)
Depo administered per provider order; counseled to use back up method like condoms for 2 weeks. Nexplanon checklist completed and pamphlet provided. Condoms declined, states she has supply. Provider orders completed.

## 2019-10-06 LAB — IGP, APTIMA HPV
HPV Aptima: NEGATIVE
PAP Smear Comment: 0

## 2019-12-22 ENCOUNTER — Ambulatory Visit (LOCAL_COMMUNITY_HEALTH_CENTER): Payer: Self-pay | Admitting: Family Medicine

## 2019-12-22 ENCOUNTER — Other Ambulatory Visit: Payer: Self-pay

## 2019-12-22 ENCOUNTER — Encounter: Payer: Self-pay | Admitting: Family Medicine

## 2019-12-22 VITALS — BP 124/85 | Ht 63.0 in | Wt 140.0 lb

## 2019-12-22 DIAGNOSIS — Z30013 Encounter for initial prescription of injectable contraceptive: Secondary | ICD-10-CM

## 2019-12-22 DIAGNOSIS — Z3009 Encounter for other general counseling and advice on contraception: Secondary | ICD-10-CM

## 2019-12-22 DIAGNOSIS — Z3042 Encounter for surveillance of injectable contraceptive: Secondary | ICD-10-CM

## 2019-12-22 MED ORDER — MEDROXYPROGESTERONE ACETATE 150 MG/ML IM SUSP
150.0000 mg | INTRAMUSCULAR | Status: AC
  Administered 2019-12-22 – 2020-05-31 (×3): 150 mg via INTRAMUSCULAR

## 2019-12-22 NOTE — Progress Notes (Signed)
S: Client is here today for her Depo.   Client desires to continue with Depo Provera until next anual exam 10/2019. A/P: Depo Provera 150 mg IM q 11-13 weeks x 3 injections.

## 2019-12-22 NOTE — Progress Notes (Signed)
Pt is 11.5 weeks post depo today. Pt was undecided about Nexplanon vs. Depo when she was at [facility] in July 2021 (for physical). Pt decided against Nexplanon for now and wants to continue with depo; for now, plans to continue with depo until physical is due.

## 2019-12-22 NOTE — Progress Notes (Signed)
Provider orders completed. 

## 2020-03-09 ENCOUNTER — Ambulatory Visit (LOCAL_COMMUNITY_HEALTH_CENTER): Payer: Self-pay

## 2020-03-09 ENCOUNTER — Other Ambulatory Visit: Payer: Self-pay

## 2020-03-09 VITALS — BP 124/82 | Ht 63.0 in | Wt 133.0 lb

## 2020-03-09 DIAGNOSIS — Z3009 Encounter for other general counseling and advice on contraception: Secondary | ICD-10-CM

## 2020-03-09 DIAGNOSIS — Z30013 Encounter for initial prescription of injectable contraceptive: Secondary | ICD-10-CM

## 2020-03-09 DIAGNOSIS — Z3042 Encounter for surveillance of injectable contraceptive: Secondary | ICD-10-CM

## 2020-03-09 NOTE — Progress Notes (Signed)
Depo given per C. Kizzie Ide FNP order; tolerated well. Richmond Campbell, RN

## 2020-05-31 ENCOUNTER — Other Ambulatory Visit: Payer: Self-pay

## 2020-05-31 ENCOUNTER — Ambulatory Visit (LOCAL_COMMUNITY_HEALTH_CENTER): Payer: Self-pay

## 2020-05-31 VITALS — BP 115/65 | Wt 131.0 lb

## 2020-05-31 DIAGNOSIS — Z3042 Encounter for surveillance of injectable contraceptive: Secondary | ICD-10-CM

## 2020-05-31 DIAGNOSIS — Z3009 Encounter for other general counseling and advice on contraception: Secondary | ICD-10-CM

## 2020-05-31 NOTE — Progress Notes (Signed)
Depo given per C. Kizzie Ide FNP order on 12/22/19; tolerated well Richmond Campbell, RN

## 2020-08-17 ENCOUNTER — Other Ambulatory Visit: Payer: Self-pay

## 2020-08-17 ENCOUNTER — Ambulatory Visit (LOCAL_COMMUNITY_HEALTH_CENTER): Payer: Self-pay

## 2020-08-17 ENCOUNTER — Ambulatory Visit: Payer: Self-pay

## 2020-08-17 VITALS — BP 131/85 | Ht 63.0 in | Wt 137.0 lb

## 2020-08-17 DIAGNOSIS — Z3009 Encounter for other general counseling and advice on contraception: Secondary | ICD-10-CM

## 2020-08-17 DIAGNOSIS — Z3042 Encounter for surveillance of injectable contraceptive: Secondary | ICD-10-CM

## 2020-08-17 MED ORDER — MEDROXYPROGESTERONE ACETATE 150 MG/ML IM SUSP
150.0000 mg | Freq: Once | INTRAMUSCULAR | Status: AC
  Administered 2020-08-17: 150 mg via INTRAMUSCULAR

## 2020-08-17 NOTE — Progress Notes (Signed)
11 weeks 1 day post depo. Voices no concerns. Depo given today per SO Dr. Ralene Bathe, MD. Tolerated well RUOQ. Pt due for physical 09/2020 and plans to have it when next depo is due, approx 11/02/2020. Pt has reminder. Jerel Shepherd, RN

## 2020-11-22 ENCOUNTER — Ambulatory Visit: Payer: Self-pay

## 2020-11-25 ENCOUNTER — Encounter: Payer: Self-pay | Admitting: Advanced Practice Midwife

## 2020-11-25 ENCOUNTER — Ambulatory Visit (LOCAL_COMMUNITY_HEALTH_CENTER): Payer: Self-pay | Admitting: Advanced Practice Midwife

## 2020-11-25 ENCOUNTER — Other Ambulatory Visit: Payer: Self-pay

## 2020-11-25 VITALS — BP 112/75 | Ht 64.5 in | Wt 137.8 lb

## 2020-11-25 DIAGNOSIS — Z8742 Personal history of other diseases of the female genital tract: Secondary | ICD-10-CM

## 2020-11-25 DIAGNOSIS — F17200 Nicotine dependence, unspecified, uncomplicated: Secondary | ICD-10-CM

## 2020-11-25 DIAGNOSIS — F1010 Alcohol abuse, uncomplicated: Secondary | ICD-10-CM

## 2020-11-25 DIAGNOSIS — Z3042 Encounter for surveillance of injectable contraceptive: Secondary | ICD-10-CM

## 2020-11-25 DIAGNOSIS — Z3009 Encounter for other general counseling and advice on contraception: Secondary | ICD-10-CM

## 2020-11-25 LAB — WET PREP FOR TRICH, YEAST, CLUE
Trichomonas Exam: NEGATIVE
Yeast Exam: NEGATIVE

## 2020-11-25 MED ORDER — MEDROXYPROGESTERONE ACETATE 150 MG/ML IM SUSP
150.0000 mg | INTRAMUSCULAR | Status: AC
  Administered 2020-11-25 – 2021-05-17 (×3): 150 mg via INTRAMUSCULAR

## 2020-11-25 NOTE — Progress Notes (Signed)
Pt here for PE and Depo.  Wet mount results reviewed, no treatment required.  Depo 150 mg given IM in LUOQ without any complications. Berdie Ogren, RN

## 2020-11-25 NOTE — Progress Notes (Signed)
[facility] DEPARTMENT [facility] [address] Main Number: [phone]    Family Planning Visit- Initial Visit  Subjective:  Holly Solomon is a 35 y.o. SWF smokekr G1P1001 (83 yo daughter)  being seen today for an initial annual visit and to discuss contraceptive options.  The patient is currently using Depo Provera for pregnancy prevention. Patient reports she does not want a pregnancy in the next year.  Patient has the following medical conditions has History of abnormal cervical Pap smear; Overweight; Asthma; Smoker 1 ppd; and Alcohol abuse daily on their problem list.  Chief Complaint  Patient presents with   Contraception    Annual exam    Patient reports here for physical and DMPA. Last DMPA 08/17/20. Last pap 10/01/19 neg HPV neg. 01/25/16 neg. 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 Smoking 1 ppd. Last cigar 2020. Last MJ 10 years ago. Last ETOH last night (1 [place] [employer]) daily. Last sex 11/21/20 without condom; with current partner since 08/18/20; 1 partner in last 3 mo. LMP not on DMPA. Last dental exam age 64. Living with a female cousin and her daughter. Not working and not in school.   Patient denies vaping   Body mass index is 23.29 kg/m. - Patient is eligible for diabetes screening based on BMI and age >50?  not applicable HA1C ordered? not applicable  Patient reports 2  partner/s in last year. Desires STI screening?  No - declines bloodwork  Has patient been screened once for HCV in the past?  No  No results found for: HCVAB  Does the patient have current drug use (including MJ), have a partner with drug use, and/or has been incarcerated since last result? No  If yes-- Screen for HCV through [facility]   Does the patient meet criteria for HBV testing? No  Criteria:  -Household, sexual or needle sharing contact with HBV -History of drug use -HIV positive -Those with known Hep C   Health Maintenance Due  Topic Date Due   COVID-19  Vaccine (1) Never done   Pneumococcal Vaccine 37-58 Years old (1 - PCV) Never done   Hepatitis C Screening  Never done   TETANUS/TDAP  01/13/2020   INFLUENZA VACCINE  10/31/2020    Review of Systems  All other systems reviewed and are negative.  The following portions of the patient's history were reviewed and updated as appropriate: allergies, current medications, past family history, past medical history, past social history, past surgical history and problem list. Problem list updated.   See flowsheet for other program required questions.  Objective:   Vitals:   11/25/20 1028  BP: 112/75  Weight: 137 lb 12.8 oz (62.5 kg)  Height: 5' 4.5" (1.638 m)    Physical Exam Constitutional:      Appearance: Normal appearance. She is normal weight.  HENT:     Head: Normocephalic and atraumatic.     Mouth/Throat:     Mouth: Mucous membranes are moist.  Eyes:     Conjunctiva/sclera: Conjunctivae normal.  Neck:     Thyroid: No thyroid mass, thyromegaly or thyroid tenderness.  Cardiovascular:     Rate and Rhythm: Normal rate and regular rhythm.  Pulmonary:     Effort: Pulmonary effort is normal.     Breath sounds: Normal breath sounds.  Chest:  Breasts:    Right: Normal.     Left: Normal.  Abdominal:     Palpations: Abdomen is soft.     Comments: Soft  without masses or tenderness  Genitourinary:    General: Normal vulva.     Exam position: Lithotomy position.     Vagina: Vaginal discharge (large amt creamy white leukorrhea, ph<4.5) present.     Cervix: Normal.     Uterus: Normal.      Adnexa: Right adnexa normal and left adnexa normal.     Rectum: Normal.  Musculoskeletal:        General: Normal range of motion.     Cervical back: Normal range of motion and neck supple.  Skin:    General: Skin is warm and dry.  Neurological:     Mental Status: She is alert.  Psychiatric:        Mood and Affect: Mood normal.      Assessment and Plan:  Holly Solomon is a 35 y.o.  female presenting to the [facility] Department for an initial annual wellness/contraceptive visit  Contraception counseling: Reviewed all forms of birth control options in the tiered based approach. available including abstinence; over the counter/barrier methods; hormonal contraceptive medication including pill, patch, ring, injection,contraceptive implant, ECP; hormonal and nonhormonal IUDs; permanent sterilization options including vasectomy and the various tubal sterilization modalities. Risks, benefits, and typical effectiveness rates were reviewed.  Questions were answered.  Written information was also given to the patient to review.  Patient desires continuation of DMPA, this was prescribed for patient. She will follow up in  11-13 wks for surveillance.  She was told to call with any further questions, or with any concerns about this method of contraception.  Emphasized use of condoms 100% of the time for STI prevention.  Patient was offered ECP. ECP was not accepted by the patient. ECP counseling was not given - see RN documentation  1. Family planning Treat wet mount per standing orders Immunization nurse consult - WET PREP FOR TRICH, YEAST, CLUE - Chlamydia/Gonorrhea [facility]  2. Encounter for surveillance of injectable contraceptive DMPA 150 mg IM q 11-13 wks x 1 year  3. Smoker 1/2 ppd Counseled via 5 A's to stop smoking  4. Alcohol abuse daily Declines ETOH cessation assistance  5. 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 01/25/16 neg. 10/01/19 neg HPV neg.      No follow-ups on file.  No future appointments.  Alberteen Spindle, CNM

## 2021-03-01 ENCOUNTER — Other Ambulatory Visit: Payer: Self-pay

## 2021-03-01 ENCOUNTER — Ambulatory Visit (LOCAL_COMMUNITY_HEALTH_CENTER): Payer: Self-pay

## 2021-03-01 VITALS — BP 118/69 | Ht 64.49 in | Wt 137.5 lb

## 2021-03-01 DIAGNOSIS — Z3042 Encounter for surveillance of injectable contraceptive: Secondary | ICD-10-CM

## 2021-03-01 DIAGNOSIS — Z3009 Encounter for other general counseling and advice on contraception: Secondary | ICD-10-CM

## 2021-03-01 NOTE — Progress Notes (Signed)
13 weeks 5 days post depo. Voices no concerns. Depo given today per order by Hazle Coca, CNM dated 11/25/2020. Tolerated well RUOQ. Next depo due 05/17/2020, pt aware. Jerel Shepherd, RN

## 2021-05-17 ENCOUNTER — Ambulatory Visit (LOCAL_COMMUNITY_HEALTH_CENTER): Payer: Self-pay

## 2021-05-17 ENCOUNTER — Other Ambulatory Visit: Payer: Self-pay

## 2021-05-17 VITALS — BP 114/77 | Ht 64.49 in | Wt 132.0 lb

## 2021-05-17 DIAGNOSIS — Z3009 Encounter for other general counseling and advice on contraception: Secondary | ICD-10-CM

## 2021-05-17 DIAGNOSIS — Z3042 Encounter for surveillance of injectable contraceptive: Secondary | ICD-10-CM

## 2021-05-17 NOTE — Progress Notes (Signed)
11 weeks 0 days post depo. Voices no concerns. Depo given today per order by Hazle Coca, CNM dated 11/25/20. Tolerated well LUOQ. Next depo due 08/02/21, pt has reminder. Jerel Shepherd, RN

## 2022-03-30 ENCOUNTER — Ambulatory Visit (LOCAL_COMMUNITY_HEALTH_CENTER): Payer: Self-pay | Admitting: Family Medicine

## 2022-03-30 ENCOUNTER — Encounter: Payer: Self-pay | Admitting: Family Medicine

## 2022-03-30 VITALS — BP 128/77 | Ht 64.0 in | Wt 127.0 lb

## 2022-03-30 DIAGNOSIS — Z3202 Encounter for pregnancy test, result negative: Secondary | ICD-10-CM

## 2022-03-30 DIAGNOSIS — Z3009 Encounter for other general counseling and advice on contraception: Secondary | ICD-10-CM

## 2022-03-30 DIAGNOSIS — Z0000 Encounter for general adult medical examination without abnormal findings: Secondary | ICD-10-CM

## 2022-03-30 DIAGNOSIS — Z3042 Encounter for surveillance of injectable contraceptive: Secondary | ICD-10-CM

## 2022-03-30 DIAGNOSIS — Z8742 Personal history of other diseases of the female genital tract: Secondary | ICD-10-CM

## 2022-03-30 LAB — PREGNANCY, URINE: Preg Test, Ur: NEGATIVE

## 2022-03-30 MED ORDER — MEDROXYPROGESTERONE ACETATE 150 MG/ML IM SUSP
150.0000 mg | INTRAMUSCULAR | Status: AC
  Administered 2022-03-30: 150 mg via INTRAMUSCULAR

## 2022-03-30 NOTE — Progress Notes (Signed)
[facility] DEPARTMENT [facility] [address] Main Number: [phone]  Family Planning Visit- Repeat Yearly Visit  Subjective:  Holly Solomon is a 36 y.o. G1P1001  being seen today for an annual wellness visit and to discuss contraception options.   The patient is currently using Hormonal Injection for pregnancy prevention. Patient does not want a pregnancy in the next year.    report they are looking for a method that provides Ready when they are    Patient has the following medical problems: has History of abnormal cervical Pap smear; Asthma; Smoker 1 ppd; and Alcohol abuse daily on their problem list.  Chief Complaint  Patient presents with   Annual Exam    Physical and restart Depo    Patient reports to clinic for physical and depo.   Patient declines smoking or alcohol cessation information.  See flowsheet for other program required questions.   Body mass index is 21.81 kg/m. - Patient is eligible for diabetes screening based on BMI> 25 and age >35?  not applicable HA1C ordered? not applicable  Patient reports 2 of partners in last year. Desires STI screening?  No - declines   Has patient been screened once for HCV in the past?  No  No results found for: "HCVAB"  Does the patient have current of drug use, have a partner with drug use, and/or has been incarcerated since last result? No  If yes-- Screen for HCV through [facility]   Does the patient meet criteria for HBV testing? No  Criteria:  -Household, sexual or needle sharing contact with HBV -History of drug use -HIV positive -Those with known Hep C   Health Maintenance Due  Topic Date Due   COVID-19 Vaccine (1) Never done   Hepatitis C Screening  Never done   DTaP/Tdap/Td (5 - Td or Tdap) 01/13/2020   INFLUENZA VACCINE  10/31/2021    Review of Systems  Constitutional:  Negative for weight loss.  Eyes:  Negative for blurred vision.  Respiratory:  Negative  for cough and shortness of breath.   Cardiovascular:  Negative for claudication.  Gastrointestinal:  Negative for nausea.  Genitourinary:  Negative for dysuria and frequency.  Skin:  Negative for rash.  Neurological:  Negative for headaches.  Endo/Heme/Allergies:  Does not bruise/bleed easily.    The following portions of the patient's history were reviewed and updated as appropriate: allergies, current medications, past family history, past medical history, past social history, past surgical history and problem list. Problem list updated.  Objective:   Vitals:   03/30/22 1535  BP: 128/77  Weight: 127 lb 0.6 oz (57.6 kg)  Height: 5\' 4"  (1.626 m)    Physical Exam    Assessment and Plan:  Holly Solomon is a 36 y.o. female G1P1001 presenting to the [facility] Department for an yearly wellness and contraception visit  Contraception counseling: Reviewed options based on patient desire and reproductive life plan. Patient is interested in Hormonal Injection. This was provided to the patient today.   Risks, benefits, and typical effectiveness rates were reviewed.  Questions were answered.  Written information was also given to the patient to review.    The patient will follow up in  3 months for depo injection.  The patient was told to call with any further questions, or with any concerns about this method of contraception.  Emphasized use of condoms 100% of the time for STI prevention.  Patient was assessed for need  for ECP. Patient was offered ECP based on Unprotected sex within past 72 hours.  Patient is within 1 days of unprotected sex. Patient was offered ECP. Reviewed options and patient desired No method of ECP, declined all     1. Well woman exam (no gynecological exam) CBE today (normal), next due 03/2025 -Pap test last 10/01/19, next due 09/30/24 -Patient has been having unprotected sex. Last was last night. Offered ECP, declined. PT negative. Depo given today,  advised to take a home PT in about 2 weeks.   -Pregnancy, urine  2. Encounter for surveillance of injectable contraceptive  - medroxyPROGESTERone (DEPO-PROVERA) injection 150 mg  3. History of abnormal cervical Pap smear Last Pap test 10/01/19, next due 09/30/24.   Return in about 3 months (around 06/29/2022), or if symptoms worsen or fail to improve.    Holly Solomon, [place]

## 2022-03-30 NOTE — Progress Notes (Signed)
Pt. seen for initial annual physical and BC management, Seen by FNP [facility].  Depo shot given in RVG, education given and questions answered. Condoms given. Family planning packet given and contents reviewed
